# Patient Record
Sex: Male | Born: 1960 | Race: White | Hispanic: No | Marital: Married | Smoking: Current every day smoker
Health system: Southern US, Community
[De-identification: ages and names within clinical notes are randomized; demographics above are authoritative.]

## PROBLEM LIST (undated history)

## (undated) DIAGNOSIS — Z720 Tobacco use: Secondary | ICD-10-CM

## (undated) DIAGNOSIS — E785 Hyperlipidemia, unspecified: Secondary | ICD-10-CM

## (undated) DIAGNOSIS — I10 Essential (primary) hypertension: Secondary | ICD-10-CM

## (undated) DIAGNOSIS — R9439 Abnormal result of other cardiovascular function study: Secondary | ICD-10-CM

## (undated) DIAGNOSIS — E118 Type 2 diabetes mellitus with unspecified complications: Secondary | ICD-10-CM

## (undated) DIAGNOSIS — E119 Type 2 diabetes mellitus without complications: Secondary | ICD-10-CM

## (undated) HISTORY — DX: Hyperlipidemia, unspecified: E78.5

## (undated) HISTORY — DX: Abnormal result of other cardiovascular function study: R94.39

## (undated) HISTORY — DX: Tobacco use: Z72.0

## (undated) HISTORY — DX: Type 2 diabetes mellitus with unspecified complications: E11.8

---

## 2005-02-22 ENCOUNTER — Ambulatory Visit: Payer: Self-pay | Admitting: Family Medicine

## 2005-06-18 ENCOUNTER — Ambulatory Visit: Payer: Self-pay | Admitting: Family Medicine

## 2005-08-04 ENCOUNTER — Ambulatory Visit: Payer: Self-pay | Admitting: Cardiology

## 2005-08-04 ENCOUNTER — Observation Stay (HOSPITAL_COMMUNITY): Admission: EM | Admit: 2005-08-04 | Discharge: 2005-08-06 | Payer: Self-pay | Admitting: Emergency Medicine

## 2005-10-29 ENCOUNTER — Ambulatory Visit: Payer: Self-pay | Admitting: Family Medicine

## 2005-11-02 ENCOUNTER — Ambulatory Visit: Payer: Self-pay | Admitting: Family Medicine

## 2005-11-16 ENCOUNTER — Ambulatory Visit: Payer: Self-pay | Admitting: Family Medicine

## 2006-02-01 ENCOUNTER — Ambulatory Visit: Payer: Self-pay | Admitting: Family Medicine

## 2006-02-08 IMAGING — CR DG CHEST 2V
2 series · 2 of 2 positions shown · non-contrast
Comparison: None.

CLINICAL DATA: Chest pain and shortness of breath. 
 CHEST ? 2 VIEW:

[w chest pa]
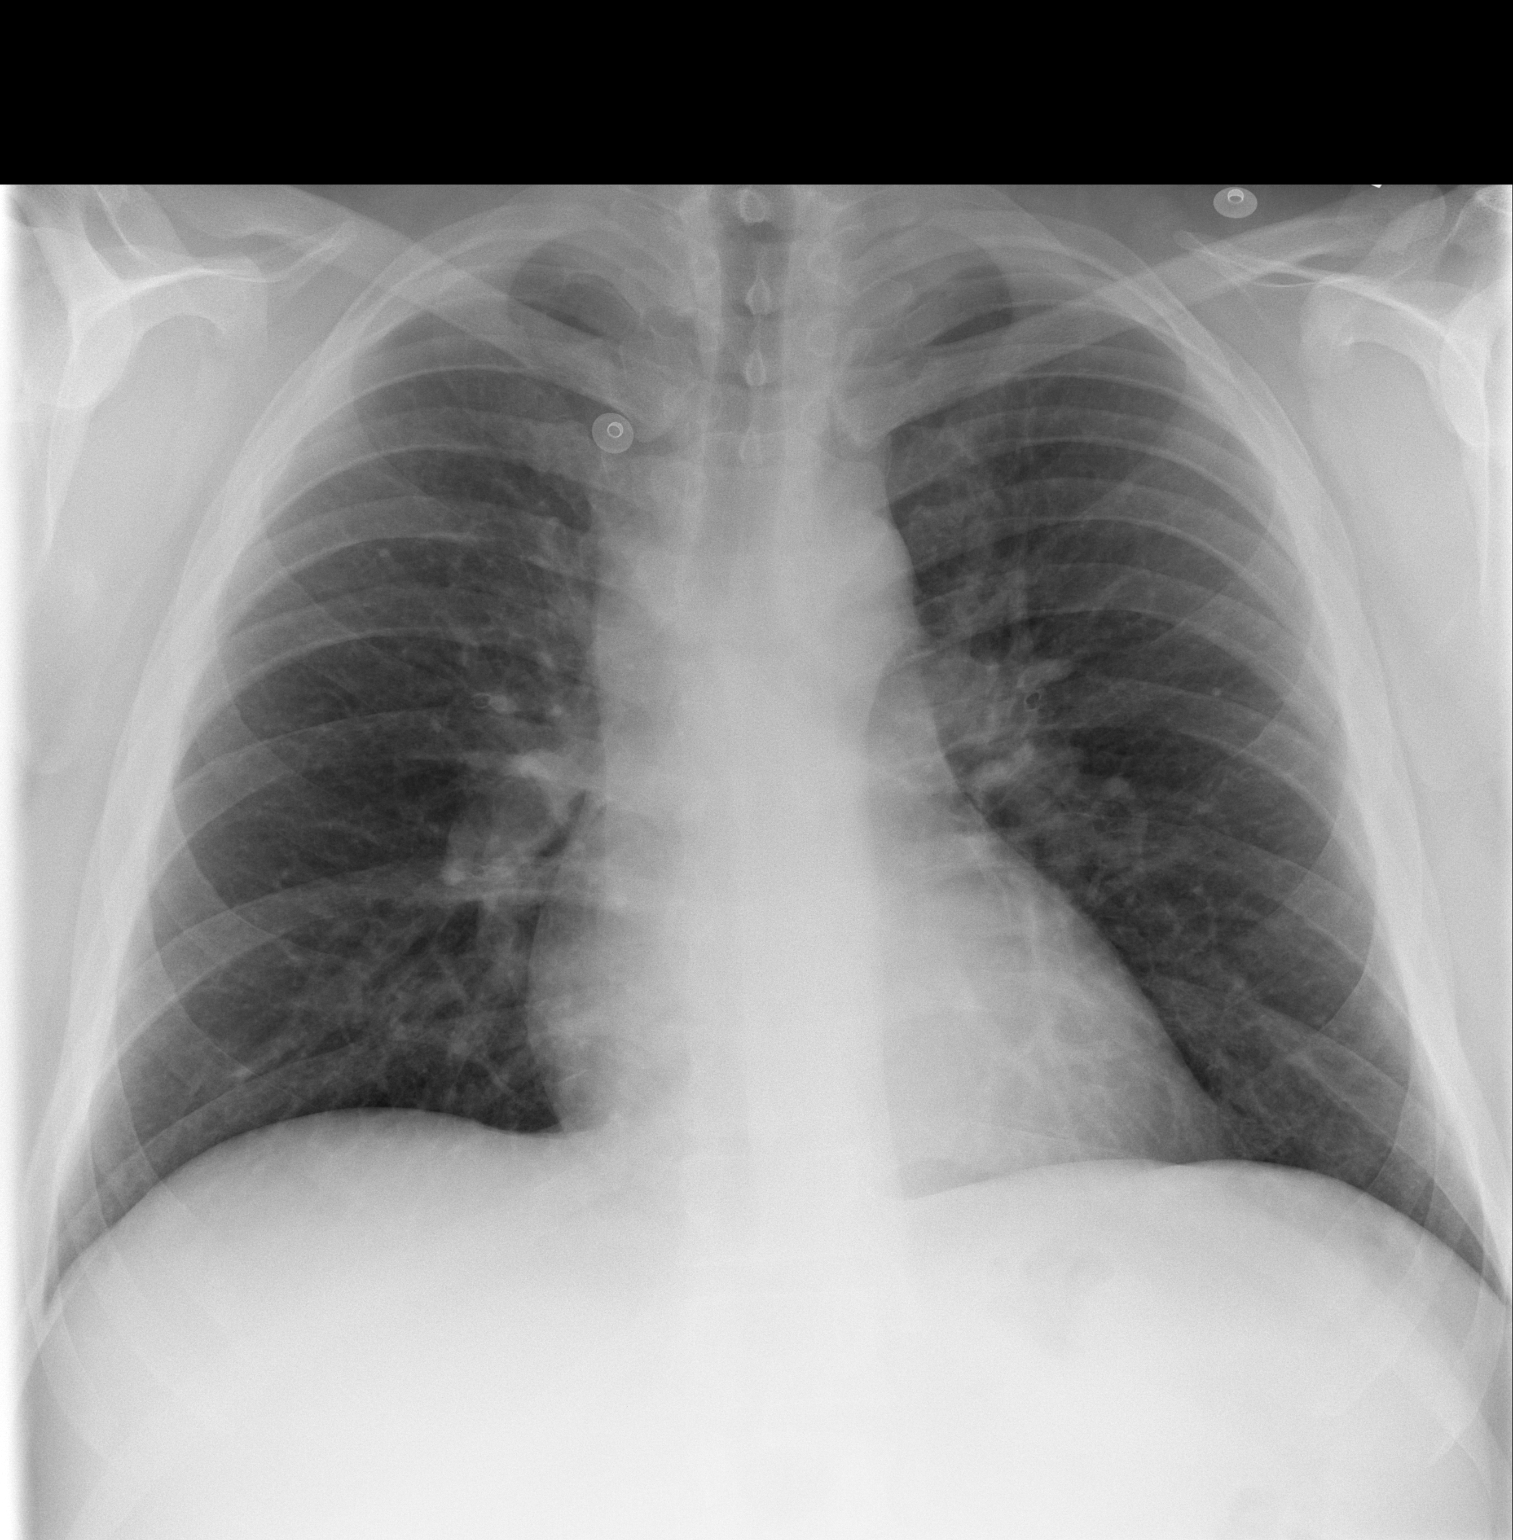

[w chest lat]
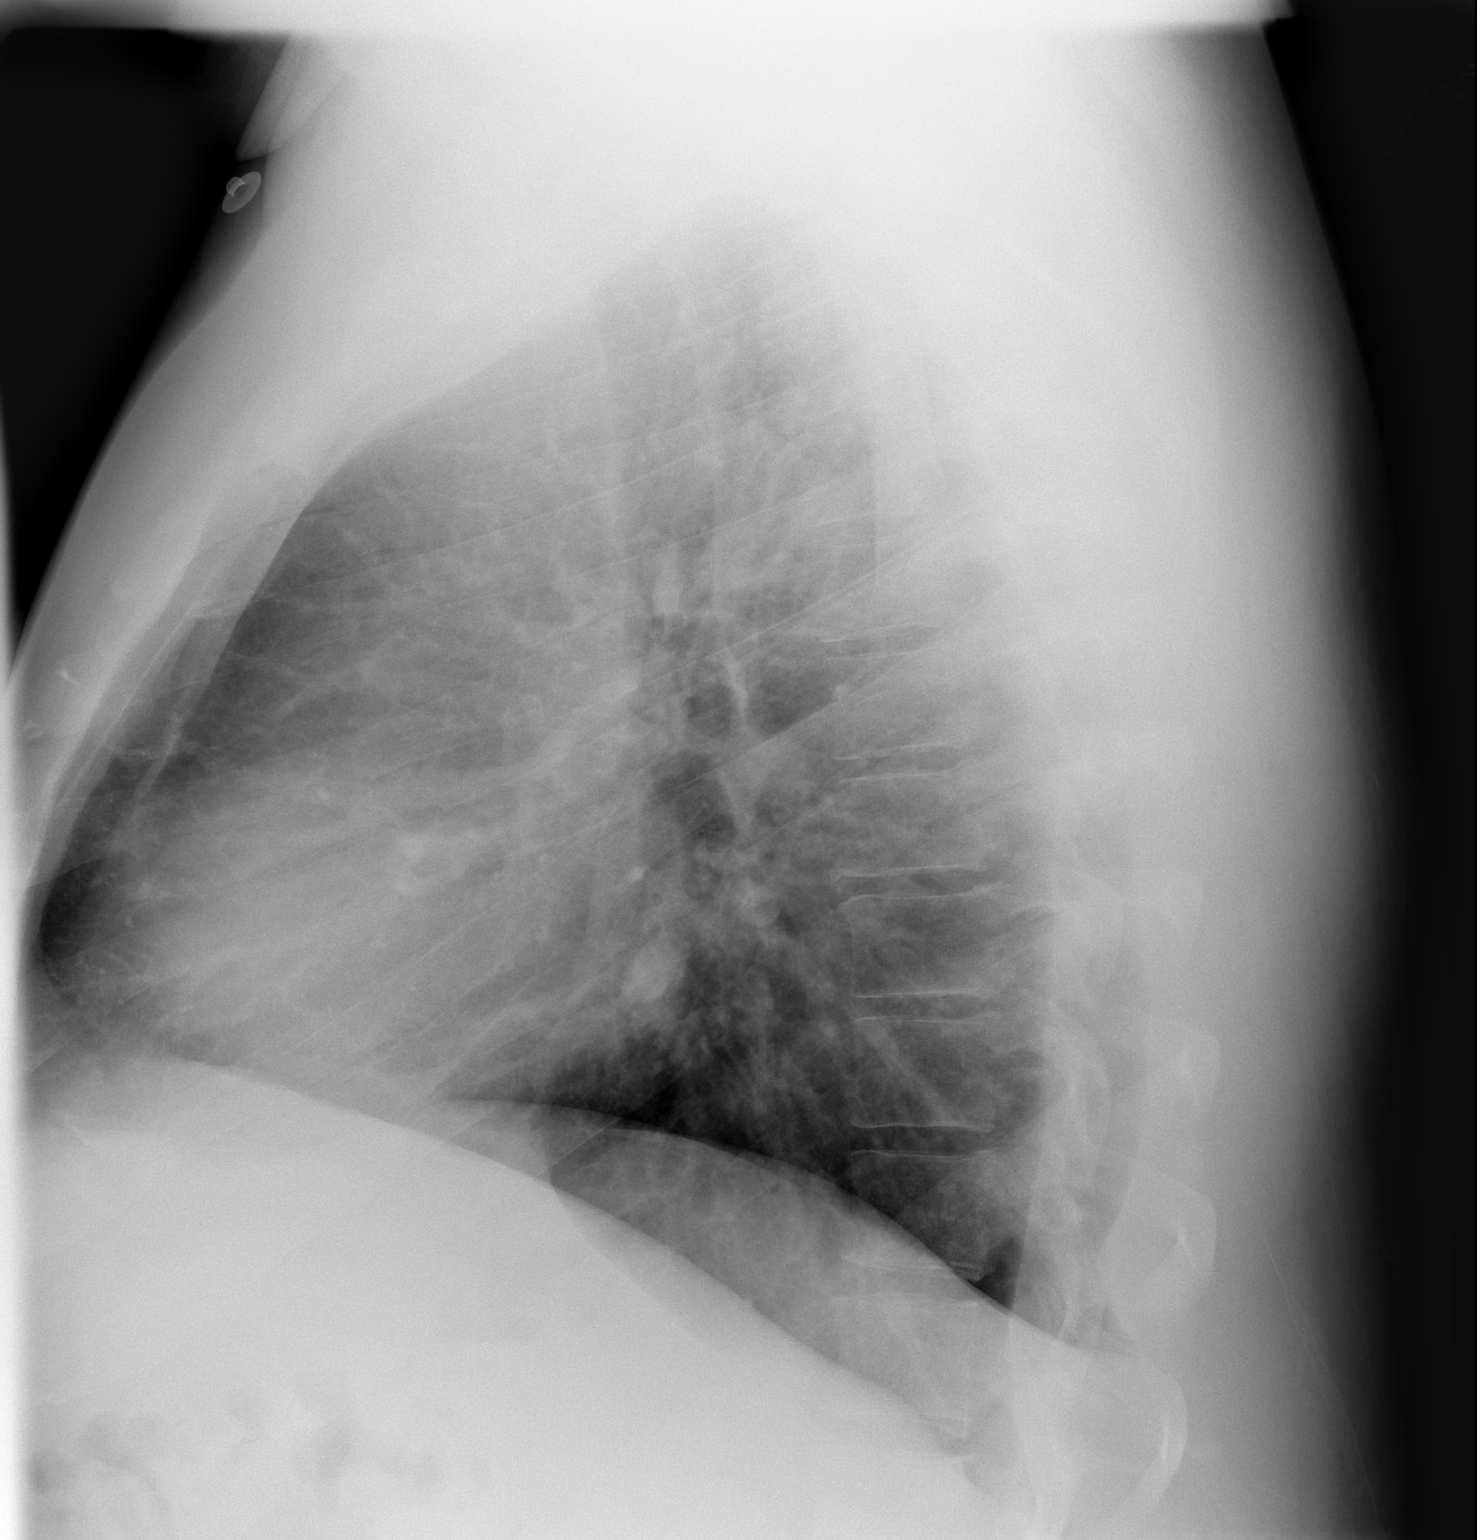

[2 of 2 positions shown; findings below may reference images not displayed]

FINDINGS: The heart size is normal.   There are no pleural effusions or pneumothorax.   No focal lung opacities are identified.
IMPRESSION: No active cardiopulmonary abnormalities.

## 2016-02-06 DIAGNOSIS — M5030 Other cervical disc degeneration, unspecified cervical region: Secondary | ICD-10-CM

## 2016-02-06 DIAGNOSIS — E785 Hyperlipidemia, unspecified: Secondary | ICD-10-CM

## 2016-02-06 DIAGNOSIS — K219 Gastro-esophageal reflux disease without esophagitis: Secondary | ICD-10-CM

## 2016-02-06 HISTORY — DX: Other cervical disc degeneration, unspecified cervical region: M50.30

## 2016-02-06 HISTORY — DX: Hyperlipidemia, unspecified: E78.5

## 2016-02-06 HISTORY — DX: Gastro-esophageal reflux disease without esophagitis: K21.9

## 2016-02-12 DIAGNOSIS — I2510 Atherosclerotic heart disease of native coronary artery without angina pectoris: Secondary | ICD-10-CM | POA: Insufficient documentation

## 2016-02-12 DIAGNOSIS — K589 Irritable bowel syndrome without diarrhea: Secondary | ICD-10-CM

## 2016-02-12 DIAGNOSIS — I10 Essential (primary) hypertension: Secondary | ICD-10-CM | POA: Insufficient documentation

## 2016-02-12 HISTORY — DX: Irritable bowel syndrome, unspecified: K58.9

## 2016-02-12 HISTORY — DX: Essential (primary) hypertension: I10

## 2016-02-12 HISTORY — DX: Atherosclerotic heart disease of native coronary artery without angina pectoris: I25.10

## 2017-08-05 DIAGNOSIS — J0100 Acute maxillary sinusitis, unspecified: Secondary | ICD-10-CM

## 2017-08-05 HISTORY — DX: Acute maxillary sinusitis, unspecified: J01.00

## 2018-06-19 DIAGNOSIS — M79605 Pain in left leg: Secondary | ICD-10-CM

## 2018-06-19 HISTORY — DX: Pain in left leg: M79.605

## 2018-10-17 DIAGNOSIS — M7062 Trochanteric bursitis, left hip: Secondary | ICD-10-CM

## 2018-10-17 HISTORY — DX: Trochanteric bursitis, left hip: M70.62

## 2018-12-25 DIAGNOSIS — S29011A Strain of muscle and tendon of front wall of thorax, initial encounter: Secondary | ICD-10-CM

## 2018-12-25 HISTORY — DX: Strain of muscle and tendon of front wall of thorax, initial encounter: S29.011A

## 2019-01-02 DIAGNOSIS — F17210 Nicotine dependence, cigarettes, uncomplicated: Secondary | ICD-10-CM

## 2019-01-02 HISTORY — DX: Nicotine dependence, cigarettes, uncomplicated: F17.210

## 2019-05-29 DIAGNOSIS — L309 Dermatitis, unspecified: Secondary | ICD-10-CM

## 2019-05-29 HISTORY — DX: Dermatitis, unspecified: L30.9

## 2019-09-04 DIAGNOSIS — B351 Tinea unguium: Secondary | ICD-10-CM

## 2019-09-04 HISTORY — DX: Tinea unguium: B35.1

## 2019-11-01 DIAGNOSIS — J302 Other seasonal allergic rhinitis: Secondary | ICD-10-CM

## 2019-11-01 HISTORY — DX: Other seasonal allergic rhinitis: J30.2

## 2020-01-09 DIAGNOSIS — U071 COVID-19: Secondary | ICD-10-CM

## 2020-01-09 HISTORY — DX: COVID-19: U07.1

## 2020-03-11 DIAGNOSIS — F419 Anxiety disorder, unspecified: Secondary | ICD-10-CM

## 2020-03-11 DIAGNOSIS — F32A Depression, unspecified: Secondary | ICD-10-CM

## 2020-03-11 HISTORY — DX: Depression, unspecified: F32.A

## 2020-03-11 HISTORY — DX: Anxiety disorder, unspecified: F41.9

## 2020-07-14 DIAGNOSIS — R1032 Left lower quadrant pain: Secondary | ICD-10-CM

## 2020-07-14 HISTORY — DX: Left lower quadrant pain: R10.32

## 2021-01-26 LAB — PROTIME-INR: INR: 1 (ref 0.9–1.1)

## 2021-01-27 ENCOUNTER — Inpatient Hospital Stay: Admission: AD | Admit: 2021-01-27 | Payer: Self-pay | Admitting: Cardiology

## 2021-01-27 DIAGNOSIS — Z720 Tobacco use: Secondary | ICD-10-CM

## 2021-01-27 DIAGNOSIS — E119 Type 2 diabetes mellitus without complications: Secondary | ICD-10-CM | POA: Diagnosis not present

## 2021-01-27 DIAGNOSIS — E7800 Pure hypercholesterolemia, unspecified: Secondary | ICD-10-CM

## 2021-01-27 DIAGNOSIS — I209 Angina pectoris, unspecified: Secondary | ICD-10-CM

## 2021-01-27 DIAGNOSIS — I10 Essential (primary) hypertension: Secondary | ICD-10-CM

## 2021-01-28 ENCOUNTER — Observation Stay (HOSPITAL_COMMUNITY)
Admission: AD | Admit: 2021-01-28 | Discharge: 2021-01-29 | Disposition: A | Discharge status: Home / Self Care | Payer: Managed Care, Other (non HMO) | Source: Other Acute Inpatient Hospital | Attending: Cardiovascular Disease | Admitting: Cardiovascular Disease

## 2021-01-28 ENCOUNTER — Encounter (HOSPITAL_COMMUNITY)
Admission: AD | Disposition: A | Discharge status: Home / Self Care | Payer: Self-pay | Source: Other Acute Inpatient Hospital | Attending: Cardiovascular Disease

## 2021-01-28 ENCOUNTER — Other Ambulatory Visit: Payer: Self-pay

## 2021-01-28 ENCOUNTER — Encounter (HOSPITAL_COMMUNITY): Payer: Self-pay | Admitting: Cardiovascular Disease

## 2021-01-28 DIAGNOSIS — I209 Angina pectoris, unspecified: Secondary | ICD-10-CM | POA: Diagnosis not present

## 2021-01-28 DIAGNOSIS — E119 Type 2 diabetes mellitus without complications: Secondary | ICD-10-CM | POA: Diagnosis not present

## 2021-01-28 DIAGNOSIS — I25119 Atherosclerotic heart disease of native coronary artery with unspecified angina pectoris: Secondary | ICD-10-CM | POA: Insufficient documentation

## 2021-01-28 DIAGNOSIS — Z720 Tobacco use: Secondary | ICD-10-CM | POA: Diagnosis present

## 2021-01-28 DIAGNOSIS — F17210 Nicotine dependence, cigarettes, uncomplicated: Secondary | ICD-10-CM | POA: Insufficient documentation

## 2021-01-28 DIAGNOSIS — E118 Type 2 diabetes mellitus with unspecified complications: Secondary | ICD-10-CM | POA: Diagnosis present

## 2021-01-28 DIAGNOSIS — Z79899 Other long term (current) drug therapy: Secondary | ICD-10-CM | POA: Diagnosis not present

## 2021-01-28 DIAGNOSIS — I2510 Atherosclerotic heart disease of native coronary artery without angina pectoris: Secondary | ICD-10-CM | POA: Diagnosis present

## 2021-01-28 DIAGNOSIS — E7800 Pure hypercholesterolemia, unspecified: Secondary | ICD-10-CM | POA: Diagnosis not present

## 2021-01-28 DIAGNOSIS — I25118 Atherosclerotic heart disease of native coronary artery with other forms of angina pectoris: Secondary | ICD-10-CM

## 2021-01-28 DIAGNOSIS — E785 Hyperlipidemia, unspecified: Secondary | ICD-10-CM | POA: Insufficient documentation

## 2021-01-28 DIAGNOSIS — R079 Chest pain, unspecified: Secondary | ICD-10-CM | POA: Diagnosis not present

## 2021-01-28 DIAGNOSIS — I10 Essential (primary) hypertension: Secondary | ICD-10-CM | POA: Diagnosis not present

## 2021-01-28 DIAGNOSIS — R9439 Abnormal result of other cardiovascular function study: Secondary | ICD-10-CM | POA: Diagnosis not present

## 2021-01-28 DIAGNOSIS — Z7984 Long term (current) use of oral hypoglycemic drugs: Secondary | ICD-10-CM | POA: Diagnosis not present

## 2021-01-28 HISTORY — PX: LEFT HEART CATH AND CORONARY ANGIOGRAPHY: CATH118249

## 2021-01-28 HISTORY — DX: Essential (primary) hypertension: I10

## 2021-01-28 HISTORY — DX: Hyperlipidemia, unspecified: E78.5

## 2021-01-28 HISTORY — DX: Atherosclerotic heart disease of native coronary artery without angina pectoris: I25.10

## 2021-01-28 HISTORY — DX: Type 2 diabetes mellitus without complications: E11.9

## 2021-01-28 HISTORY — DX: Tobacco use: Z72.0

## 2021-01-28 LAB — GLUCOSE, CAPILLARY: Glucose-Capillary: 134 mg/dL — ABNORMAL HIGH (ref 70–99)

## 2021-01-28 SURGERY — LEFT HEART CATH AND CORONARY ANGIOGRAPHY
Anesthesia: LOCAL

## 2021-01-28 MED ORDER — ALPRAZOLAM 0.25 MG PO TABS: 0.2500 mg | ORAL_TABLET | Freq: Two times a day (BID) | ORAL | Status: DC | PRN

## 2021-01-28 MED ORDER — SODIUM CHLORIDE 0.9 % IV SOLN: 250.0000 mL | INTRAVENOUS | Status: DC | PRN

## 2021-01-28 MED ORDER — MIDAZOLAM HCL 2 MG/2ML IJ SOLN
INTRAMUSCULAR | Status: AC
  Filled 2021-01-28: qty 2

## 2021-01-28 MED ORDER — SODIUM CHLORIDE 0.9% FLUSH: 3.0000 mL | INTRAVENOUS | Status: DC | PRN

## 2021-01-28 MED ORDER — METOPROLOL TARTRATE 25 MG PO TABS
25.0000 mg | ORAL_TABLET | Freq: Two times a day (BID) | ORAL | Status: DC
  Administered 2021-01-29: 25 mg via ORAL
  Filled 2021-01-28 (×2): qty 1

## 2021-01-28 MED ORDER — LIDOCAINE HCL (PF) 1 % IJ SOLN
INTRAMUSCULAR | Status: DC | PRN
  Administered 2021-01-28: 2 mL

## 2021-01-28 MED ORDER — SODIUM CHLORIDE 0.9% FLUSH
3.0000 mL | Freq: Two times a day (BID) | INTRAVENOUS | Status: DC
  Administered 2021-01-28: 3 mL via INTRAVENOUS

## 2021-01-28 MED ORDER — VERAPAMIL HCL 2.5 MG/ML IV SOLN
INTRAVENOUS | Status: AC
  Filled 2021-01-28: qty 2

## 2021-01-28 MED ORDER — VERAPAMIL HCL 2.5 MG/ML IV SOLN
INTRAVENOUS | Status: DC | PRN
  Administered 2021-01-28: 10 mL via INTRA_ARTERIAL

## 2021-01-28 MED ORDER — LIDOCAINE HCL (PF) 1 % IJ SOLN
INTRAMUSCULAR | Status: AC
  Filled 2021-01-28: qty 30

## 2021-01-28 MED ORDER — HEPARIN SODIUM (PORCINE) 1000 UNIT/ML IJ SOLN
INTRAMUSCULAR | Status: DC | PRN
  Administered 2021-01-28: 5000 [IU] via INTRAVENOUS

## 2021-01-28 MED ORDER — ATORVASTATIN CALCIUM 80 MG PO TABS
80.0000 mg | ORAL_TABLET | Freq: Every day | ORAL | Status: DC
  Administered 2021-01-28 – 2021-01-29 (×2): 80 mg via ORAL
  Filled 2021-01-28 (×2): qty 1

## 2021-01-28 MED ORDER — HEPARIN (PORCINE) IN NACL 1000-0.9 UT/500ML-% IV SOLN
INTRAVENOUS | Status: DC | PRN
  Administered 2021-01-28 (×2): 500 mL

## 2021-01-28 MED ORDER — ISOSORBIDE MONONITRATE ER 30 MG PO TB24
30.0000 mg | ORAL_TABLET | Freq: Every day | ORAL | Status: DC
  Administered 2021-01-28 – 2021-01-29 (×2): 30 mg via ORAL
  Filled 2021-01-28 (×2): qty 1

## 2021-01-28 MED ORDER — HEPARIN (PORCINE) IN NACL 1000-0.9 UT/500ML-% IV SOLN
INTRAVENOUS | Status: AC
  Filled 2021-01-28: qty 1000

## 2021-01-28 MED ORDER — SODIUM CHLORIDE 0.9 % IV SOLN: INTRAVENOUS | Status: AC

## 2021-01-28 MED ORDER — ONDANSETRON HCL 4 MG/2ML IJ SOLN: 4.0000 mg | Freq: Four times a day (QID) | INTRAMUSCULAR | Status: DC | PRN

## 2021-01-28 MED ORDER — LABETALOL HCL 5 MG/ML IV SOLN: 10.0000 mg | INTRAVENOUS | Status: AC | PRN

## 2021-01-28 MED ORDER — FENTANYL CITRATE (PF) 100 MCG/2ML IJ SOLN
INTRAMUSCULAR | Status: AC
  Filled 2021-01-28: qty 2

## 2021-01-28 MED ORDER — ASPIRIN 81 MG PO CHEW
81.0000 mg | CHEWABLE_TABLET | Freq: Every day | ORAL | Status: DC
  Administered 2021-01-28 – 2021-01-29 (×2): 81 mg via ORAL
  Filled 2021-01-28 (×2): qty 1

## 2021-01-28 MED ORDER — FENTANYL CITRATE (PF) 100 MCG/2ML IJ SOLN
INTRAMUSCULAR | Status: DC | PRN
  Administered 2021-01-28 (×2): 25 ug via INTRAVENOUS

## 2021-01-28 MED ORDER — ACETAMINOPHEN 325 MG PO TABS
650.0000 mg | ORAL_TABLET | ORAL | Status: DC | PRN
  Administered 2021-01-29: 650 mg via ORAL
  Filled 2021-01-28 (×2): qty 2

## 2021-01-28 MED ORDER — MIDAZOLAM HCL 2 MG/2ML IJ SOLN
INTRAMUSCULAR | Status: DC | PRN
  Administered 2021-01-28: 2 mg via INTRAVENOUS
  Administered 2021-01-28: 1 mg via INTRAVENOUS

## 2021-01-28 MED ORDER — HYDRALAZINE HCL 20 MG/ML IJ SOLN: 10.0000 mg | INTRAMUSCULAR | Status: AC | PRN

## 2021-01-28 MED ORDER — HEPARIN SODIUM (PORCINE) 1000 UNIT/ML IJ SOLN
INTRAMUSCULAR | Status: AC
  Filled 2021-01-28: qty 1

## 2021-01-28 SURGICAL SUPPLY — 10 items
CATH INFINITI JR4 5F (CATHETERS) ×2 IMPLANT
CATH OPTITORQUE TIG 4.0 5F (CATHETERS) ×1 IMPLANT
DEVICE RAD COMP TR BAND LRG (VASCULAR PRODUCTS) ×2 IMPLANT
GLIDESHEATH SLEND SS 6F .021 (SHEATH) ×1 IMPLANT
GUIDEWIRE INQWIRE 1.5J.035X260 (WIRE) ×1 IMPLANT
INQWIRE 1.5J .035X260CM (WIRE) ×2
KIT HEART LEFT (KITS) ×2 IMPLANT
PACK CARDIAC CATHETERIZATION (CUSTOM PROCEDURE TRAY) ×2 IMPLANT
TRANSDUCER W/STOPCOCK (MISCELLANEOUS) ×2 IMPLANT
TUBING CIL FLEX 10 FLL-RA (TUBING) ×2 IMPLANT

## 2021-01-28 NOTE — H&P (Addendum)
Cardiology Admission History and Physical:   Patient ID: Dennis Hardy MRN: [id number]; DOB: Dec 08, 1961   Admission date: 01/28/2021  Primary Care Provider: Kyla Balzarine, PA-C [facility] Cardiologist: Dennis Brothers, MD  [facility] Electrophysiologist:  None   Chief Complaint:  Chest pain with abnormal stress test  Patient Profile:   Dennis Hardy is a 60 y.o. male with PMH of HTN, HLD, DM and tobacco use who presented to [facility] with chest pain, underwent stress testing which was abnormal and sent to [facility] for cardiac cath.   History of Present Illness:   Dennis Hardy is a 60 year old male with past medical history of hypertension, hyperlipidemia, diabetes and tobacco use.  He was admitted in 2015 with complaints of chest pain.  Underwent stress testing which showed evidence of ischemia and he was transferred to [facility] regional for heart cath.  Patient stated he underwent cath but was told he had mild disease in 2 vessels at that time with no intervention.  Also reports he had similar symptoms about a year ago while he was in [facility] and was admitted to [facility] where he underwent a chest pain work-up which was negative.  Presented to [facility] ED with reports of left-sided chest pain with radiation into his left arm with shortness of breath.  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  As a result he was evaluated in the emergency room as a result of this.  CTA of chest/abdomen/pelvis showed no acute pathology or evidence of PE.  He was seen in consultation by Dr. Tomie Hardy.  Underwent stress testing which was significantly abnormal with small to moderate region of reversible ischemia in the anterior septal wall of the mid ventricle and apex.  EF noted at 54%.  As a result he was transferred to [facility] to undergo cardiac  catheterization.  Labs at [facility] showed stable electrolytes, creatinine 0.8, LDL 62, HDL 25, negative troponin.    Past Medical History:  Diagnosis Date  . Diabetes (HCC)   . Hyperlipidemia   . Hypertension   . Tobacco use     History reviewed. No pertinent surgical history.   Medications Prior to Admission: Prior to Admission medications   Not on File     Allergies:    Allergies  Allergen Reactions  . Meloxicam   . Penicillins   . Prednisone     Social History:   Social History   Socioeconomic History  . Marital status: Married    Spouse name: Not on file  . Number of children: Not on file  . Years of education: Not on file  . Highest education level: Not on file  Occupational History  . Not on file  Tobacco Use  . Smoking status: Not on file  . Smokeless tobacco: Not on file  Substance and Sexual Activity  . Alcohol use: Not on file  . Drug use: Not on file  . Sexual activity: Not on file  Other Topics Concern  . Not on file  Social History Narrative  . Not on file   Social Determinants of Health   Financial Resource Strain: Not on file  Food Insecurity: Not on file  Transportation Needs: Not on file  Physical Activity: Not on file  Stress: Not on file  Social Connections: Not on file  Intimate Partner Violence:  Not on file    Family History:   The patient's family history includes Hypertension in his father.    ROS:  Please see the history of present illness.  All other ROS reviewed and negative.     Physical Exam/Data:  There were no vitals filed for this visit. No intake or output data in the 24 hours ending 01/28/21 1437 No flowsheet data found.   There is no height or weight on file to calculate BMI.  General:  Well nourished, well developed, in no acute distress HEENT: normal Lymph: no adenopathy Neck: no JVD Endocrine:  No thryomegaly Vascular: No carotid bruits; FA pulses 2+ bilaterally without bruits  Cardiac:  normal  S1, S2; RRR; no murmur  Lungs:  clear to auscultation bilaterally, no wheezing, rhonchi or rales  Abd: soft, nontender, no hepatomegaly  Ext: no edema Musculoskeletal:  No deformities, BUE and BLE strength normal and equal Skin: warm and dry  Neuro:  CNs 2-12 intact, no focal abnormalities noted Psych:  Normal affect    EKG:  The ECG that was done 01/26/21 was personally reviewed and demonstrates SR 71 bpm no ST/TW changes  Relevant CV Studies:  Lexiscan myoview: small to moderate region of reversible ischemia in the anterior septal wall of the mid ventricle and apex  Laboratory Data:  High Sensitivity Troponin:  No results for input(s): TROPONINIHS in the last 720 hours.    ChemistryNo results for input(s): NA, K, CL, CO2, GLUCOSE, BUN, CREATININE, CALCIUM, GFRNONAA, GFRAA, ANIONGAP in the last 168 hours.  No results for input(s): PROT, ALBUMIN, AST, ALT, ALKPHOS, BILITOT in the last 168 hours. HematologyNo results for input(s): WBC, RBC, HGB, HCT, MCV, MCH, MCHC, RDW, PLT in the last 168 hours. BNPNo results for input(s): BNP, PROBNP in the last 168 hours.  DDimer No results for input(s): DDIMER in the last 168 hours.   Radiology/Studies:  No results found.   Assessment and Plan:   Dennis Hardy is a 60 y.o. male with PMH of HTN, HLD, DM and tobacco use who presented to [facility] with chest pain, underwent stress testing which was abnormal and sent to [facility] for cardiac cath.   1. Chest pain with abnormal stress test: presented to [facility] with chest pain. Underwent Lexiscan myoview which was abnormal. Transferred to [facility] for further evaluation with cardiac cath. -- further recommendations pending cardiac cath  2.  Hypertension: Prior to admission was on lisinopril 20 mg daily, metoprolol 25 mg twice daily  3.  Hyperlipidemia: On atorvastatin 80 mg prior to admission  4.  DM: Hemoglobin A1c 7.3 --Home meds include Jardiance 10 mg daily, Januvia 100 mg  daily  4. Tobacco use: will need cessation education   Severity of Illness: The appropriate patient status for this patient is OBSERVATION. Observation status is judged to be reasonable and necessary in order to provide the required intensity of service to ensure the patient's safety. The patient's presenting symptoms, physical exam findings, and initial radiographic and laboratory data in the context of their medical condition is felt to place them at decreased risk for further clinical deterioration. Furthermore, it is anticipated that the patient will be medically stable for discharge from the hospital within 2 midnights of admission. The following factors support the patient status of observation.   " The patient's presenting symptoms include chest pain. " The physical exam findings include stable exam. " The initial radiographic and laboratory data are abnormal stress test.     For questions  or updates, please contact [facility] Please consult [email or web address] for contact info under     Signed, Laverda Page, NP  01/28/2021 2:37 PM   Patient seen and examined. Agree with assessment and plan.  Dennis Hardy is a 60 year old gentleman who was transferred from [facility] for cardiac catheterization.  He has a history of hypertension, hyperlipidemia, diabetes mellitus, and tobacco history for 45 years.  He undergone a prior cardiac catheterization approximately 10 years ago and was told of having mild blockage in small vessels.  He developed chest pain worrisome for angina pectoris leading to his hospitalization at [facility] on Monday, January 31.  He underwent a stress test which was abnormal and reportedly demonstrated EF of 54% with mild mid anteroseptal ischemia.  There were no wall motion abnormalities.  He was transferred today for definitive cardiac catheterization.  Currently the patient is chest pain-free.  He is aware of the risk/benefits of the procedure and potential need  for PCI if indicated.   Lennette Bihari, MD, [facility] 01/28/2021 2:38 PM

## 2021-01-29 ENCOUNTER — Telehealth: Payer: Self-pay | Admitting: Cardiology

## 2021-01-29 ENCOUNTER — Encounter (HOSPITAL_COMMUNITY): Payer: Self-pay | Admitting: Cardiovascular Disease

## 2021-01-29 DIAGNOSIS — R079 Chest pain, unspecified: Secondary | ICD-10-CM | POA: Diagnosis not present

## 2021-01-29 DIAGNOSIS — R9439 Abnormal result of other cardiovascular function study: Secondary | ICD-10-CM | POA: Diagnosis not present

## 2021-01-29 HISTORY — DX: Chest pain, unspecified: R07.9

## 2021-01-29 LAB — BASIC METABOLIC PANEL
Anion gap: 9 (ref 5–15)
BUN: 12 mg/dL (ref 6–20)
CO2: 26 mmol/L (ref 22–32)
Calcium: 8.8 mg/dL — ABNORMAL LOW (ref 8.9–10.3)
Chloride: 103 mmol/L (ref 98–111)
Creatinine, Ser: 0.9 mg/dL (ref 0.61–1.24)
GFR, Estimated: >60 mL/min (ref >60)
Glucose, Bld: 144 mg/dL — ABNORMAL HIGH (ref 70–99)
Potassium: 3.5 mmol/L (ref 3.5–5.1)
Sodium: 138 mmol/L (ref 135–145)

## 2021-01-29 LAB — LIPID PANEL
Cholesterol: 124 mg/dL (ref 0–200)
HDL: 26 mg/dL — ABNORMAL LOW (ref >40)
LDL Cholesterol: 54 mg/dL (ref 0–99)
Total CHOL/HDL Ratio: 4.8 RATIO
Triglycerides: 218 mg/dL — ABNORMAL HIGH (ref <150)
VLDL: 44 mg/dL — ABNORMAL HIGH (ref 0–40)

## 2021-01-29 LAB — CBC
HCT: 43.8 % (ref 39.0–52.0)
Hemoglobin: 14.6 g/dL (ref 13.0–17.0)
MCH: 29.6 pg (ref 26.0–34.0)
MCHC: 33.3 g/dL (ref 30.0–36.0)
MCV: 88.7 fL (ref 80.0–100.0)
Platelets: 210 10*3/uL (ref 150–400)
RBC: 4.94 MIL/uL (ref 4.22–5.81)
RDW: 12.9 % (ref 11.5–15.5)
WBC: 9.1 10*3/uL (ref 4.0–10.5)
nRBC: 0 % (ref 0.0–0.2)

## 2021-01-29 MED ORDER — LISINOPRIL 20 MG PO TABS: 20.0000 mg | ORAL_TABLET | Freq: Every day | ORAL | 6 refills | Status: AC

## 2021-01-29 MED ORDER — METOPROLOL TARTRATE 25 MG PO TABS: 25.0000 mg | ORAL_TABLET | Freq: Two times a day (BID) | ORAL | 6 refills | Status: AC

## 2021-01-29 MED ORDER — ATORVASTATIN CALCIUM 80 MG PO TABS: 80.0000 mg | ORAL_TABLET | Freq: Every day | ORAL | 6 refills | Status: AC

## 2021-01-29 MED ORDER — NICOTINE 21 MG/24HR TD PT24: 21.0000 mg | MEDICATED_PATCH | Freq: Every day | TRANSDERMAL | 0 refills | Status: DC

## 2021-01-29 MED ORDER — ISOSORBIDE MONONITRATE ER 30 MG PO TB24: 30.0000 mg | ORAL_TABLET | Freq: Every day | ORAL | 6 refills | Status: DC

## 2021-01-29 NOTE — Progress Notes (Signed)
Patient ambulated the hall independently with fast steady gait. No symptoms on intolerance, no chest pain, shortness of breath, lightheadness or dizziness. BP before ambulation 109/70 and after ambulation 127/76. Cardiac monitor SR 60's at rest and 80's with ambulation.

## 2021-01-29 NOTE — Progress Notes (Addendum)
Progress Note  Patient Name: SEBASTIEN JACKSON Date of Encounter: 01/29/2021  [facility] Cardiologist: Garwin Brothers, MD   Subjective   Mild intermittent chest pain on left side which reproducible with palpations. No dyspnea.   Inpatient Medications    Scheduled Meds: . aspirin  81 mg Oral Daily  . atorvastatin  80 mg Oral Daily  . isosorbide mononitrate  30 mg Oral Daily  . metoprolol tartrate  25 mg Oral BID  . sodium chloride flush  3 mL Intravenous Q12H   Continuous Infusions: . sodium chloride     PRN Meds: sodium chloride, acetaminophen, ALPRAZolam, ondansetron (ZOFRAN) IV, sodium chloride flush   Vital Signs    Vitals:   01/28/21 2225 01/28/21 2229 01/29/21 0031 01/29/21 0444  BP: (!) 95/59 (!) 95/59 103/66 96/74  Pulse: (!) 50 (!) 52 (!) 51 77  Resp: 16  16 16   Temp:  98.1 F (36.7 C)  97.9 F (36.6 C)  TempSrc:  Oral  Oral  SpO2: 94%  96% 97%  Weight:    97.7 kg  Height:        Intake/Output Summary (Last 24 hours) at 01/29/2021 0843 Last data filed at 01/28/2021 1836 Gross per 24 hour  Intake 190.44 ml  Output --  Net 190.44 ml   Last 3 Weights 01/29/2021 01/28/2021  Weight (lbs) 215 lb 4.8 oz 212 lb  Weight (kg) 97.659 kg 96.163 kg      Telemetry    Sinus rhythm  - Personally Reviewed  ECG    Sinus bradycardia at 49 bpm - Personally Reviewed  Physical Exam   GEN: No acute distress.   Neck: No JVD Cardiac: RRR, no murmurs, rubs, or gallops. R radial cath site without hematoma.  Respiratory: Clear to auscultation bilaterally. GI: Soft, nontender, non-distended  MS: No edema; No deformity. Neuro:  Nonfocal  Psych: Normal affect   Labs    High Sensitivity Troponin:  No results for input(s): TROPONINIHS in the last 720 hours.    Chemistry Recent Labs  Lab 01/29/21 0158  NA 138  K 3.5  CL 103  CO2 26  GLUCOSE 144*  BUN 12  CREATININE 0.90  CALCIUM 8.8*  GFRNONAA >60  ANIONGAP 9     Hematology Recent Labs  Lab  01/29/21 0158  WBC 9.1  RBC 4.94  HGB 14.6  HCT 43.8  MCV 88.7  MCH 29.6  MCHC 33.3  RDW 12.9  PLT 210    BNPNo results for input(s): BNP, PROBNP in the last 168 hours.   DDimer No results for input(s): DDIMER in the last 168 hours.   Radiology    CARDIAC CATHETERIZATION  Result Date: 01/28/2021  Prox RCA lesion is 50% stenosed.  Ost Cx lesion is 10% stenosed.  Prox LAD lesion is 30% stenosed.  Prox LAD to Mid LAD lesion is 30% stenosed.  Mid LAD lesion is 40% stenosed.  Prox Cx lesion is 40% stenosed.  The left ventricular systolic function is normal.  LV end diastolic pressure is normal.  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 Normal LV function with EF estimated 55% without focal wall motion abnormalities.  LVEDP 12 mmHg. RECOMMENDATION: Medical therapy.  Will initiate nitrates and beta-blocker therapy.  Smoking cessation is essential. Aggressive lipid-lowering  therapy with target LDL less than 70.    Cardiac Studies   LEFT HEART CATH AND CORONARY ANGIOGRAPHY    Conclusion    Prox RCA lesion is 50% stenosed.  Ost Cx lesion is 10% stenosed.  Prox LAD lesion is 30% stenosed.  Prox LAD to Mid LAD lesion is 30% stenosed.  Mid LAD lesion is 40% stenosed.  Prox Cx lesion is 40% stenosed.  The left ventricular systolic function is normal.  LV end diastolic pressure is normal.   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  Normal LV function with EF estimated 55% without focal wall motion abnormalities.  LVEDP 12  mmHg.  RECOMMENDATION: Medical therapy.  Will initiate nitrates and beta-blocker therapy.  Smoking cessation is essential. Aggressive lipid-lowering therapy with target LDL less than 70.  Diagnostic Dominance: Right    Patient Profile     CREWS MCCOLLAM is a 59 y.o. male with PMH of HTN, HLD, DM and tobacco use who presented to [facility] with chest pain, underwent stress testing which was abnormal and sent to [facility] for cardiac cath.   Assessment & Plan    1. CAD - Echo with mild to moderate non obstructive CAD as above.  - Normal LV function with EF estimated 55% without focal wall motion abnormalities.  LVEDP 12 mmHg. - Patient with mild chest discomfort at L upper chest which is reproducible with palpations consistent with MSK in etiology. No dyspnea or pleuritic symptoms.  - Continue ASA, Lipitor, metoprolol 25mg  BID (will not increase further due to baseline bradycardia) and Imdur.   2. HTN - BP soft - Continue BB - Held home lisinopril  3. DM - Resume home meds  4. Tobacco smoking - Cessation recommended  5. HLD - Continue Lipitor 80gm qd - will try to add lipid panel if not need as outpatient.   Ambulate and discharge this morning.   For questions or updates, please contact [facility] Please consult [email or web address] for contact info under     Signed , PA  01/29/2021, 8:43 AM    Patient seen, examined. Available data reviewed. Agree with findings, assessment, and plan as outlined by 03/29/2021. Cath films reviewed - extensive nonobstructive plaquing noted. Pt needs aggressive med Rx. I agree there is no high grade stenosis and no indication for PCI. On my exam, heart is RRR no murmur, lungs clear, no leg edema, and right radial cath site is clear. DC home today with recommendations as above. Tobacco cessation counseling done. He is down from >2ppd to 1/2 ppd now.   Chelsea Aus, M.D. 01/29/2021 9:20 AM

## 2021-01-29 NOTE — Discharge Instructions (Signed)
Call [facility] at [phone] if any bleeding, swelling or drainage at cath site.  May shower, no tub baths for 48 hours for groin sticks. No lifting over 5 pounds for 3 days.  No Driving for 3 days, no work for 5 days.  Heart healthy diabetic diet  Call the office for questions and concerns  Bring all meds with you to office visit.  Stop tobacco.  We ordered nicotine patch, do not smoke and wear patch at same time.

## 2021-01-29 NOTE — Discharge Summary (Signed)
Discharge Summary    Patient ID: Dennis Hardy MRN: [id number]; DOB: 06-13-1961  Admit date: 01/28/2021 Discharge date: 01/29/2021  Primary Care Provider: Kyla Balzarine, PA-C  Primary Cardiologist: Garwin Brothers, MD  Primary Electrophysiologist:  None   Discharge Diagnoses    Principal Problem:   Abnormal nuclear stress test Active Problems:   Chest pain of uncertain etiology   CAD (coronary artery disease), native coronary artery   Tobacco abuse   Type 2 diabetes mellitus with complication, without long-term current use of insulin (HCC)   Hyperlipidemia with target low density lipoprotein (LDL) cholesterol less than 70 mg/dL    Diagnostic Studies/Procedures    Cardiac cath 01/28/21   Prox RCA lesion is 50% stenosed.  Ost Cx lesion is 10% stenosed.  Prox LAD lesion is 30% stenosed.  Prox LAD to Mid LAD lesion is 30% stenosed.  Mid LAD lesion is 40% stenosed.  Prox Cx lesion is 40% stenosed.  The left ventricular systolic function is normal.  LV end diastolic pressure is normal.   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  Normal LV function with EF estimated 55% without focal wall motion abnormalities.  LVEDP 12 mmHg.  RECOMMENDATION: Medical therapy.  Will initiate nitrates and beta-blocker therapy.  Smoking cessation is essential. Aggressive lipid-lowering therapy with target LDL less than 70.   ____ Left Ventricle The left ventricular size is normal. The left ventricular systolic function is normal. LV end diastolic pressure is normal. No regional wall motion abnormalities. Normal LV function with EF estimated 55%.  LVEDP 12 mmHg.   ______Coronary Diagrams   Diagnostic Dominance: Right     ___   History of Present Illness      Dennis Hardy is a 60 y.o. male with hx of HTN, HLD, DM and tobacco use and non obstructed CAD in 2015.  Pt presented to [facility] ER with lt chest pain and radiation into his left arm with SOB.  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  CTA of Chest abd and pelvis with no PE and no dissection.  He underwent stress test which was significantly abnormal with small to moderate region of reversible ischemia in the anterior septal wall of the mid ventricle and apex.  EF noted at 54%.  He was transferred to [facility] for further eval with cardiac cath.  Prior to admit he was on lisinopril and metoprolol. He was on [place] for DM and last A1c was 7.3.   Labs at [facility] showed stable electrolytes, creatinine 0.8, LDL 62, HDL 25, negative troponin.   Exam was stable.   The ECG that was done 01/26/21 was personally reviewed and demonstrates SR 71 bpm no ST/TW changes  Hospital Course     Consultants: none   Pt underwent cardiac cath without complications.  He was found to have non obstructive disease and medical therapy recommended.  Discussed smoking cessation.  LDL goal < 70.   Echo at [facility] was stable.   Today still with some mild chest discomfort Lt upper chest found to be muscular skeletal.  He was in SR to SB and with mild brady will not increase his BB.   His LDL 54, TG 218 and HDL 26.  Cr. 0.90  Pt seen and evaluated by Dr.  Cooper and found stable for discharge.  He will follow up with Dr Tomie China in 7 days or so.    Did the patient have an acute coronary syndrome (MI, NSTEMI, STEMI, etc) this admission?:  No                               Did the patient have a percutaneous coronary intervention (stent / angioplasty)?:  No.       _____________  Discharge Vitals Blood pressure 127/76, pulse 68, temperature 98.7 F (37.1 C), temperature source Oral, resp. rate 20, height 6' (1.829 m), weight 97.7 kg, SpO2 98 %.  Filed Weights   01/28/21 1543 01/29/21 0444  Weight: 96.2 kg  97.7 kg    Labs & Radiologic Studies    CBC Recent Labs    01/29/21 0158  WBC 9.1  HGB 14.6  HCT 43.8  MCV 88.7  PLT 210   Basic Metabolic Panel Recent Labs    22/29/79 0158  NA 138  K 3.5  CL 103  CO2 26  GLUCOSE 144*  BUN 12  CREATININE 0.90  CALCIUM 8.8*   Liver Function Tests No results for input(s): AST, ALT, ALKPHOS, BILITOT, PROT, ALBUMIN in the last 72 hours. No results for input(s): LIPASE, AMYLASE in the last 72 hours. High Sensitivity Troponin:   No results for input(s): TROPONINIHS in the last 720 hours.  BNP Invalid input(s): POCBNP D-Dimer No results for input(s): DDIMER in the last 72 hours. Hemoglobin A1C No results for input(s): HGBA1C in the last 72 hours. Fasting Lipid Panel Recent Labs    01/29/21 0846  CHOL 124  HDL 26*  LDLCALC 54  TRIG 892*  CHOLHDL 4.8   Thyroid Function Tests No results for input(s): TSH, T4TOTAL, T3FREE, THYROIDAB in the last 72 hours.  Invalid input(s): FREET3 _____________  CARDIAC CATHETERIZATION  Result Date: 01/28/2021  Prox RCA lesion is 50% stenosed.  Ost Cx lesion is 10% stenosed.  Prox LAD lesion is 30% stenosed.  Prox LAD to Mid LAD lesion is 30% stenosed.  Mid LAD lesion is 40% stenosed.  Prox Cx lesion is 40% stenosed.  The left ventricular systolic function is normal.  LV end diastolic pressure is normal.  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 Normal LV function with EF estimated 55% without focal wall motion abnormalities.  LVEDP 12 mmHg. RECOMMENDATION: Medical therapy.  Will initiate nitrates and beta-blocker therapy.  Smoking cessation is essential. Aggressive lipid-lowering therapy with target LDL less than 70.   Disposition   Pt is being discharged home today in good  condition.  Follow-up Plans & Appointments   Call [facility] at [phone] if any bleeding, swelling or drainage at cath site.  May shower, no tub baths for 48 hours for groin sticks. No lifting over 5 pounds for 3 days.  No Driving for 3 days, no work for 5 days.  Heart healthy diabetic diet  Call the office for questions and concerns  Bring all meds with you to office visit.  Stop tobacco.  We ordered nicotine patch, do not smoke and wear patch at same time.      Follow-up Information    Revankar, Aundra Dubin, MD Follow up  on 02/05/2021.   Specialty: Cardiology Why: at 9:40 AM come to office 15 min earlier to check in. Contact information: [address] [place] [address] [phone]                Discharge Medications   Allergies as of 01/29/2021      Reactions   Prednisone Anaphylaxis, Swelling, Other (See Comments)   "My throat swells"   Penicillins Other (See Comments)   From childhood- reaction not recalled, but IS allergic   Meloxicam Other (See Comments)   "Caused really bad headaches"      Medication List    TAKE these medications   Airborne Elderberry Chew Chew 1-2 tablets by mouth daily.   albuterol 108 (90 Base) MCG/ACT inhaler Commonly known as: VENTOLIN HFA Inhale 1-2 puffs into the lungs every 6 (six) hours as needed for wheezing or shortness of breath.   ascorbic acid 500 MG tablet Commonly known as: VITAMIN C Take 500-1,000 mg by mouth daily.   aspirin EC 81 MG tablet Take 81 mg by mouth at bedtime. Swallow whole.   atorvastatin 80 MG tablet Commonly known as: LIPITOR Take 1 tablet (80 mg total) by mouth daily. What changed: when to take this   empagliflozin 10 MG Tabs tablet Commonly known as: JARDIANCE Take 10 mg by mouth in the morning.   fluticasone 50 MCG/ACT nasal spray Commonly known as: FLONASE Place 1-2 sprays into both nostrils at bedtime as needed for allergies or rhinitis.   isosorbide  mononitrate 30 MG 24 hr tablet Commonly known as: IMDUR Take 1 tablet (30 mg total) by mouth daily.   lisinopril 20 MG tablet Commonly known as: ZESTRIL Take 1 tablet (20 mg total) by mouth at bedtime.   metoprolol tartrate 25 MG tablet Commonly known as: LOPRESSOR Take 1 tablet (25 mg total) by mouth 2 (two) times daily.   nicotine 21 mg/24hr patch Commonly known as: NICODERM CQ - dosed in mg/24 hours Place 1 patch (21 mg total) onto the skin daily.   omeprazole 40 MG capsule Commonly known as: PRILOSEC Take 40 mg by mouth 2 (two) times daily before a meal.   sitaGLIPtin 100 MG tablet Commonly known as: JANUVIA Take 100 mg by mouth in the morning.   VITAMIN D3 PO Take 1 capsule by mouth daily with breakfast.   ZINC PO Take 1 tablet by mouth 2 (two) times daily.          Outstanding Labs/Studies   Per Dr. Tomie China  Duration of Discharge Encounter   Greater than 30 minutes including physician time.  Signed, Nada Boozer, NP 01/29/2021, 9:38 AM

## 2021-01-29 NOTE — Telephone Encounter (Signed)
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 Will check tomorrow for dc.

## 2021-01-29 NOTE — Telephone Encounter (Signed)
TOC Patient- Please call Patient- Pt have an appt with Dr Tomie China on 02-05-21

## 2021-02-03 NOTE — Telephone Encounter (Signed)
Patient contacted regarding discharge from [facility] on 01/29/21.  Patient understands to follow up with provider Revankar on 02/05/21 at 10:40 at [facility]. Patient understands discharge instructions? yes Patient understands medications and regiment? yes Patient understands to bring all medications to this visit? yes

## 2021-02-04 DIAGNOSIS — I10 Essential (primary) hypertension: Secondary | ICD-10-CM | POA: Insufficient documentation

## 2021-02-04 DIAGNOSIS — Z720 Tobacco use: Secondary | ICD-10-CM | POA: Insufficient documentation

## 2021-02-04 DIAGNOSIS — E785 Hyperlipidemia, unspecified: Secondary | ICD-10-CM | POA: Insufficient documentation

## 2021-02-04 DIAGNOSIS — E119 Type 2 diabetes mellitus without complications: Secondary | ICD-10-CM | POA: Insufficient documentation

## 2021-02-05 ENCOUNTER — Other Ambulatory Visit: Payer: Self-pay

## 2021-02-05 ENCOUNTER — Ambulatory Visit (INDEPENDENT_AMBULATORY_CARE_PROVIDER_SITE_OTHER): Payer: Managed Care, Other (non HMO) | Admitting: Cardiology

## 2021-02-05 ENCOUNTER — Encounter: Payer: Self-pay | Admitting: Cardiology

## 2021-02-05 VITALS — BP 122/66 | HR 80 | Ht 72.0 in | Wt 219.2 lb

## 2021-02-05 DIAGNOSIS — F17210 Nicotine dependence, cigarettes, uncomplicated: Secondary | ICD-10-CM | POA: Insufficient documentation

## 2021-02-05 DIAGNOSIS — I10 Essential (primary) hypertension: Secondary | ICD-10-CM | POA: Diagnosis not present

## 2021-02-05 DIAGNOSIS — E785 Hyperlipidemia, unspecified: Secondary | ICD-10-CM

## 2021-02-05 DIAGNOSIS — I25118 Atherosclerotic heart disease of native coronary artery with other forms of angina pectoris: Secondary | ICD-10-CM

## 2021-02-05 HISTORY — DX: Nicotine dependence, cigarettes, uncomplicated: F17.210

## 2021-02-05 NOTE — Progress Notes (Signed)
Cardiology Office Note:    Date:  02/05/2021   ID:  Dennis Hardy, DOB Mar 21, 1961, MRN [id number]  PCP:  Kyla Balzarine, PA-C  Cardiologist:  Garwin Brothers, MD   Referring MD: Kyla Balzarine, PA-C    ASSESSMENT:    1. Coronary artery disease involving native coronary artery of native heart with other form of angina pectoris (HCC)   2. Hyperlipidemia with target low density lipoprotein (LDL) cholesterol less than 70 mg/dL   3. Cigarette smoker   4. Essential hypertension    PLAN:    In order of problems listed above:  1. Coronary artery disease: I discussed my findings with the patient at extensive length.  Coronary angiography report was discussed.  I told him that he has to practice very aggressive secondary prevention and is agreeable.  I told him to walk at least half an hour a day 5 days a week and he promises to do so. 2. Essential hypertension: Blood pressure stable and diet was emphasized.  He promises to do better. 3. Mixed dyslipidemia: Patient on statin therapy and will be back in 6 weeks for liver lipid check.  Diet and exercise emphasized.  Weight reduction was stressed.  He has abdominal obesity and I discussed the risks of it and he understood. 4. Cigarette smoker: I spent 5 minutes with the patient discussing solely about smoking. Smoking cessation was counseled. I suggested to the patient also different medications and pharmacological interventions. Patient is keen to try stopping on its own at this time. He will get back to me if he needs any further assistance in this matter. 5. Patient will be seen in follow-up appointment in 3 months or earlier if the patient has any concerns    Medication Adjustments/Labs and Tests Ordered: Current medicines are reviewed at length with the patient today.  Concerns regarding medicines are outlined above.  Orders Placed This Encounter  Procedures  . Basic metabolic panel  . Hepatic function panel  . Lipid panel   No orders  of the defined types were placed in this encounter.    No chief complaint on file.    History of Present Illness:    Dennis Hardy is a 60 y.o. male.  Patient has past medical history of essential hypertension dyslipidemia and cigarette smoking.  He came to the hospital with chest pain underwent stress testing which was abnormal.  For this reason he underwent coronary angiography.  This revealed nonobstructive disease in the coronary angiography report has been mentioned below at extensive length.  Patient denies any problems at this time and takes care of activities of daily living.  Unfortunately continues to smoke.  At the time of my evaluation, the patient is alert awake oriented and in no distress.  Past Medical History:  Diagnosis Date  . Abnormal nuclear stress test   . Anxiety and depression 03/11/2020  . CAD (coronary artery disease), native coronary artery 01/28/2021  . Chest pain of uncertain etiology 01/29/2021  . Chest wall muscle strain 12/25/2018  . Cigarette nicotine dependence without complication 01/02/2019  . Coronary arteriosclerosis 02/12/2016  . Degeneration, intervertebral disc, cervical 02/06/2016  . Dermatitis 05/29/2019  . Diabetes (HCC)   . Dyslipidemia 02/06/2016  . Essential hypertension 02/12/2016  . Gastroesophageal reflux disease without esophagitis 02/06/2016  . Hyperlipidemia   . Hyperlipidemia with target low density lipoprotein (LDL) cholesterol less than 70 mg/dL   . Hypertension   . Lab test positive for detection of COVID-19 virus 01/09/2020  .  Left lower quadrant abdominal pain 07/14/2020  . Leg pain, posterior, left 06/19/2018  . Onychomycosis of great toe 09/04/2019  . Seasonal allergies 11/01/2019  . Tobacco abuse   . Tobacco use   . Trochanteric bursitis of left hip 10/17/2018   Formatting of this note might be different from the original. 2019  . Type 2 diabetes mellitus with complication, without long-term current use of insulin [facility])     Past  Surgical History:  Procedure Laterality Date  . CHOLECYSTECTOMY, LAPAROSCOPIC Bilateral   . LEFT HEART CATH AND CORONARY ANGIOGRAPHY N/A 01/28/2021   Procedure: LEFT HEART CATH AND CORONARY ANGIOGRAPHY;  Surgeon: Lennette Bihari, MD;  Location: [facility] INVASIVE CV LAB;  Service: Cardiovascular;  Laterality: N/A;    Current Medications: Current Meds  Medication Sig  . albuterol (VENTOLIN HFA) 108 (90 Base) MCG/ACT inhaler Inhale 1-2 puffs into the lungs every 6 (six) hours as needed for wheezing or shortness of breath.  Marland Kitchen ascorbic acid (VITAMIN C) 500 MG tablet Take 500-1,000 mg by mouth daily.  Marland Kitchen aspirin EC 81 MG tablet Take 81 mg by mouth at bedtime. Swallow whole.  Marland Kitchen atorvastatin (LIPITOR) 80 MG tablet Take 1 tablet (80 mg total) by mouth daily.  . empagliflozin (JARDIANCE) 10 MG TABS tablet Take 10 mg by mouth in the morning.  . fluticasone (FLONASE) 50 MCG/ACT nasal spray Place 1-2 sprays into both nostrils at bedtime as needed for allergies or rhinitis.  Marland Kitchen isosorbide mononitrate (IMDUR) 30 MG 24 hr tablet Take 1 tablet (30 mg total) by mouth daily.  Marland Kitchen lisinopril (ZESTRIL) 20 MG tablet Take 1 tablet (20 mg total) by mouth at bedtime.  . metoprolol tartrate (LOPRESSOR) 25 MG tablet Take 1 tablet (25 mg total) by mouth 2 (two) times daily.  . Misc Natural Products (AIRBORNE ELDERBERRY) CHEW Chew 1-2 tablets by mouth daily.  . Multiple Vitamins-Minerals (ZINC PO) Take 1 tablet by mouth 2 (two) times daily.  . nicotine (NICODERM CQ - DOSED IN MG/24 HOURS) 21 mg/24hr patch Place 1 patch (21 mg total) onto the skin daily.  Marland Kitchen omeprazole (PRILOSEC) 40 MG capsule Take 40 mg by mouth 2 (two) times daily before a meal.  . sitaGLIPtin (JANUVIA) 100 MG tablet Take 100 mg by mouth in the morning.     Allergies:   Prednisone, Penicillins, and Meloxicam   Social History   Socioeconomic History  . Marital status: Married    Spouse name: Not on file  . Number of children: Not on file  . Years of  education: High school  . Highest education level: Not on file  Occupational History  . Occupation: [employer] doctor  Tobacco Use  . Smoking status: Current Every Day Smoker    Packs/day: 2.00    Years: 45.00    Pack years: 90.00    Types: Cigarettes  . Smokeless tobacco: Never Used  Vaping Use  . Vaping Use: Never used  Substance and Sexual Activity  . Alcohol use: Not Currently  . Drug use: Never  . Sexual activity: Yes    Partners: Female  Other Topics Concern  . Not on file  Social History Narrative  . Not on file   Social Determinants of Health   Financial Resource Strain: Not on file  Food Insecurity: Not on file  Transportation Needs: Not on file  Physical Activity: Not on file  Stress: Not on file  Social Connections: Not on file     Family History: The patient's family history includes  Hypertension in his father.  ROS:   Please see the history of present illness.    All other systems reviewed and are negative.  EKGs/Labs/Other Studies Reviewed:    The following studies were reviewed today: LEFT HEART CATH AND CORONARY ANGIOGRAPHY    Conclusion    Prox RCA lesion is 50% stenosed.  Ost Cx lesion is 10% stenosed.  Prox LAD lesion is 30% stenosed.  Prox LAD to Mid LAD lesion is 30% stenosed.  Mid LAD lesion is 40% stenosed.  Prox Cx lesion is 40% stenosed.  The left ventricular systolic function is normal.  LV end diastolic pressure is normal.   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  Normal LV function with EF estimated 55% without focal wall motion abnormalities.  LVEDP 12 mmHg.  RECOMMENDATION: Medical therapy.  Will initiate nitrates and beta-blocker therapy.  Smoking cessation is essential. Aggressive lipid-lowering  therapy with target LDL less than 70.    Recent Labs: 01/29/2021: BUN 12; Creatinine, Ser 0.90; Hemoglobin 14.6; Platelets 210; Potassium 3.5; Sodium 138  Recent Lipid Panel    Component Value Date/Time   CHOL 124 01/29/2021 0846   TRIG 218 (H) 01/29/2021 0846   HDL 26 (L) 01/29/2021 0846   CHOLHDL 4.8 01/29/2021 0846   VLDL 44 (H) 01/29/2021 0846   LDLCALC 54 01/29/2021 0846    Physical Exam:    VS:  BP 122/66   Pulse 80   Ht 6' (1.829 m)   Wt 219 lb 3.2 oz (99.4 kg)   SpO2 96%   BMI 29.73 kg/m     Wt Readings from Last 3 Encounters:  02/05/21 219 lb 3.2 oz (99.4 kg)  01/29/21 215 lb 4.8 oz (97.7 kg)     GEN: Patient is in no acute distress HEENT: Normal NECK: No JVD; No carotid bruits LYMPHATICS: No lymphadenopathy CARDIAC: Hear sounds regular, 2/6 systolic murmur at the apex. RESPIRATORY:  Clear to auscultation without rales, wheezing or rhonchi  ABDOMEN: Soft, non-tender, non-distended MUSCULOSKELETAL:  No edema; No deformity  SKIN: Warm and dry NEUROLOGIC:  Alert and oriented x 3 PSYCHIATRIC:  Normal affect   Signed, Garwin Brothers, MD  02/05/2021 11:17 AM    [facility]

## 2021-02-05 NOTE — Patient Instructions (Addendum)
Medication Instructions:  No medication changes. *If you need a refill on your cardiac medications before your next appointment, please call your pharmacy*   Lab Work: Your physician recommends that you return for lab work in: 3 months (03/19/2020) You need to have labs done when you are fasting.  You can come Monday through Friday 8:30 am to 12:00 pm and 1:15 to 4:30. You do not need to make an appointment as the order has already been placed. The labs you are going to have done are BMET, LFT and Lipids.   If you have labs (blood work) drawn today and your tests are completely normal, you will receive your results only by: Marland Kitchen MyChart Message (if you have MyChart) OR . A paper copy in the mail If you have any lab test that is abnormal or we need to change your treatment, we will call you to review the results.   Testing/Procedures: None ordered   Follow-Up: At [facility], you and your health needs are our priority.  As part of our continuing mission to provide you with exceptional heart care, we have created designated Provider Care Teams.  These Care Teams include your primary Cardiologist (physician) and Advanced Practice Providers (APPs -  Physician Assistants and Nurse Practitioners) who all work together to provide you with the care you need, when you need it.  We recommend signing up for the patient portal called "MyChart".  Sign up information is provided on this After Visit Summary.  MyChart is used to connect with patients for Virtual Visits (Telemedicine).  Patients are able to view lab/test results, encounter notes, upcoming appointments, etc.  Non-urgent messages can be sent to your provider as well.   To learn more about what you can do with MyChart, go to [email or web address].    Your next appointment:   3 month(s)  The format for your next appointment:   In Person  Provider:   Belva Crome, MD   Other Instructions NA

## 2021-03-05 ENCOUNTER — Ambulatory Visit: Payer: Managed Care, Other (non HMO) | Admitting: Cardiology

## 2021-05-11 ENCOUNTER — Other Ambulatory Visit: Payer: Self-pay | Admitting: Cardiology

## 2021-05-15 ENCOUNTER — Ambulatory Visit: Payer: Managed Care, Other (non HMO) | Admitting: Cardiology

## 2021-07-03 ENCOUNTER — Other Ambulatory Visit: Payer: Self-pay | Admitting: Cardiology

## 2021-10-30 ENCOUNTER — Other Ambulatory Visit: Payer: Self-pay | Admitting: Cardiology

## 2021-11-28 ENCOUNTER — Other Ambulatory Visit: Payer: Self-pay | Admitting: Cardiology

## 2021-12-28 ENCOUNTER — Other Ambulatory Visit: Payer: Self-pay | Admitting: Cardiology

## 2022-03-10 ENCOUNTER — Other Ambulatory Visit: Payer: Self-pay | Admitting: Cardiology
